# Patient Record
Sex: Male | Born: 1990 | Race: White | Hispanic: No | Marital: Single | Smoking: Current every day smoker
Health system: Southern US, Community
[De-identification: ages and names within clinical notes are randomized; demographics above are authoritative.]

---
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
## 2010-02-14 ENCOUNTER — Emergency Department (HOSPITAL_COMMUNITY): Admission: EM | Admit: 2010-02-14 | Discharge: 2010-02-14 | Payer: Self-pay | Admitting: Family Medicine

## 2010-06-24 ENCOUNTER — Encounter: Admission: RE | Admit: 2010-06-24 | Discharge: 2010-08-19 | Payer: Self-pay | Admitting: Family Medicine

## 2011-04-24 NOTE — H&P (Signed)
   NAME:  Benjamin Conrad, Benjamin Conrad                          ACCOUNT NO.:  [id number]   MEDICAL RECORD NO.:  [id number]                   PATIENT TYPE:  EMS   LOCATION:  [facility]O                                 FACILITY:  [facility]   PHYSICIAN:  Burnard Bunting, M.D.                 DATE OF BIRTH:  [birth year]   DATE OF ADMISSION:  12/28/2002  DATE OF DISCHARGE:                                HISTORY & PHYSICAL   CHIEF COMPLAINT:  Right wrist pain.   HISTORY OF PRESENT ILLNESS:  The patient is an 20 year old right-hand-  dominant patient who fell on his right wrist today jumping over a fence.  He  reports right wrist pain and denies any other orthopedic complaints.  He did  not lose consciousness.   PAST MEDICAL AND SURGICAL HISTORY:  Past medical and surgical history is  notable for a left brachial plexus birth palsy.   MEDICATIONS:  No medications currently.   ALLERGIES:  No allergies currently.   PHYSICAL EXAMINATION:  GENERAL:  On exam, he is in mild distress.  CHEST:  Chest is clear to auscultation.  HEART:  Heart beat is regular rate and rhythm.  ABDOMEN:  Exam is benign.  EXTREMITIES:  Right elbow and shoulder range of motion are intact.  Right  wrist has a deformity, apex dorsal.  Capillary refill is 2 to 3 seconds.  Radial and ulnar sensation is intact.  Median sensation is decreased.  Compartments are soft and he does have a weak EPL, FPL and interosseous  function.   X-RAY FINDINGS:  X-rays show both-bone forearm fracture which is distal with  100% radial displacement.   IMPRESSION:  Both-bone forearm fracture.   PLAN:  Closed reduction and percutaneous pinning versus open reduction.  Risks and benefits were discussed with the patient and the family.  Primary  risks include, but are not limited to, infection, nonunion, malunion, need  for more surgery, nerve and vessel damage.  The patient understands the  risks and benefits and will proceed.            Burnard Bunting, M.D.    GSD/MEDQ  D:  12/28/2002  T:  12/29/2002  Job:  [id number]

## 2011-04-24 NOTE — Op Note (Signed)
   NAME:  Benjamin Conrad, Benjamin Conrad                          ACCOUNT NO.:  [id number]   MEDICAL RECORD NO.:  [id number]                   PATIENT TYPE:  INP   LOCATION:  6127                                 FACILITY:  [facility]   PHYSICIAN:  Burnard Bunting, M.D.                 DATE OF BIRTH:  01-06-1991   DATE OF PROCEDURE:  12/28/2002  DATE OF DISCHARGE:  12/29/2002                                 OPERATIVE REPORT   PREOPERATIVE DIAGNOSIS:  Right both bones forearm fracture.   POSTOPERATIVE DIAGNOSIS:  Right both bones forearm fracture.   PROCEDURE:  Closed reduction and percutaneous pinning of right both bones  forearm fracture.   SURGEON:  Burnard Bunting, M.D.   ANESTHESIA:  General endotracheal.   ESTIMATED BLOOD LOSS:  2 cc.   DRAINS:  None.   PROCEDURE IN DETAIL:  The patient was brought to the operating room where  general endotracheal anesthesia was induced. Preoperatively, antibiotics  were administered.  The right arm was prepped with DuraPrep solution and  draped in a sterile manner.  Under fluoroscopy, the patient's both bones  distal forearm fracture was reduced.  This good reduction was achieved on  the second attempt.  At this time, two small incisions were made distal to  the radial styloid.  Blunt dissection was performed, and two 0.62 K wires  were placed across from the radial styloid across the fracture site.  Good  reduction was achieved.  Both pins were placed and were passed in their  first attempt with no extra passes through the growth plate.  A good  reduction was achieved.  Pins were advanced, Bactroban cream was placed  around the pin sites, along with Xeroform dressing.  The patient was then  placed in a long arm double sugar tong splint.  Good reduction was from the  AP and lateral planes. The patient tolerated the procedure well without  immediate complications and was transferred to the recovery room in stable  condition.               Burnard Bunting, M.D.    GSD/MEDQ  D:  01/01/2003  T:  01/01/2003  Job:  [id number]

## 2011-07-05 ENCOUNTER — Inpatient Hospital Stay (INDEPENDENT_AMBULATORY_CARE_PROVIDER_SITE_OTHER)
Admission: RE | Admit: 2011-07-05 | Discharge: 2011-07-05 | Disposition: A | Discharge status: Home / Self Care | Payer: BC Managed Care – PPO | Source: Ambulatory Visit | Attending: Family Medicine | Admitting: Family Medicine

## 2011-07-05 DIAGNOSIS — H60399 Other infective otitis externa, unspecified ear: Secondary | ICD-10-CM

## 2012-05-24 ENCOUNTER — Emergency Department (HOSPITAL_COMMUNITY)
Admission: EM | Admit: 2012-05-24 | Discharge: 2012-05-24 | Disposition: A | Discharge status: Home / Self Care | Payer: BC Managed Care – PPO | Source: Home / Self Care | Attending: Emergency Medicine | Admitting: Emergency Medicine

## 2012-05-24 ENCOUNTER — Encounter (HOSPITAL_COMMUNITY): Payer: Self-pay | Admitting: Emergency Medicine

## 2012-05-24 DIAGNOSIS — T148XXA Other injury of unspecified body region, initial encounter: Secondary | ICD-10-CM

## 2012-05-24 DIAGNOSIS — W57XXXA Bitten or stung by nonvenomous insect and other nonvenomous arthropods, initial encounter: Secondary | ICD-10-CM

## 2012-05-24 DIAGNOSIS — T148 Other injury of unspecified body region: Secondary | ICD-10-CM

## 2012-05-24 MED ORDER — MUPIROCIN 2 % EX OINT: TOPICAL_OINTMENT | Freq: Three times a day (TID) | CUTANEOUS | Status: AC

## 2012-05-24 MED ORDER — DOXYCYCLINE HYCLATE 100 MG PO TABS: 100.0000 mg | ORAL_TABLET | Freq: Two times a day (BID) | ORAL | Status: AC

## 2012-05-24 NOTE — ED Provider Notes (Signed)
Chief Complaint  Patient presents with  . Insect Bite    History of Present Illness:   The patient is a 21 year old male who found a tick attached to his right jaw at the midline about 2 days ago. He was able to remove the tick. There is a crusted area where the tick was attached surrounded by a swelling, tenderness, and itching. He denies any fever or chills. He does have a slight headache. He denies any stiff neck, muscle aches, or generalized skin rash.  Review of Systems:  Other than noted above, the patient denies any of the following symptoms: Systemic:  No fever, chills, sweats, weight loss, or fatigue. ENT:  No nasal congestion, rhinorrhea, sore throat, swelling of lips, tongue or throat. Resp:  No cough, wheezing, or shortness of breath. Skin:  No rash, itching, nodules, or suspicious lesions.  PMFSH:  Past medical history, family history, social history, meds, and allergies were reviewed.  Physical Exam:   Vital signs:  BP 103/68  Pulse 54  Temp 97.9 F (36.6 C) (Oral)  Resp 16  SpO2 97% Gen:  Alert, oriented, in no distress. ENT:  Pharynx clear, no intraoral lesions, moist mucous membranes. Lungs:  Clear to auscultation. Skin:  On the left jaw at the midline he has an 8mm crusted area surrounded by a 4 x 4 centimeter area of erythema. This was mildly tender to palpation. There was no fluctuance, no regional adenopathy.  Assessment:  The encounter diagnosis was Insect bite. Tick bite with localized infection versus allergic reaction to the tick bite. No evidence of systemic illness right now.  Plan:   1.  The following meds were prescribed:   New Prescriptions   DOXYCYCLINE (VIBRA-TABS) 100 MG TABLET    Take 1 tablet (100 mg total) by mouth 2 (two) times daily.   MUPIROCIN OINTMENT (BACTROBAN) 2 %    Apply topically 3 (three) times daily.   2.  The patient was instructed in symptomatic care and handouts were given. 3.  The patient was told to return if becoming worse in  any way, if no better in 3 or 4 days, and given some red flag symptoms that would indicate earlier return. The patient was told to be on the lookout for signs of systemic illness: Tick bite such as fever, chills, headache, muscle aches, or generalized rash and if he develops anything to return as soon as possible. 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 He's on doxycycline so that should get of any intubating tick borne illness.     Reuben Likes, MD 05/24/12 [phone]

## 2012-05-24 NOTE — ED Notes (Signed)
Pt herewith redness and tenderness under left jaw from tick bite x3  dys ago.states he pulled a live bite off yesterday.c/o slight h/a but no body aches or fever.using alcohol but has some clear drainage.tender to touch and sore

## 2012-05-24 NOTE — Discharge Instructions (Signed)
Wood Tick Bite Ticks are insects that attach themselves to the skin. Most tick bites are harmless, but sometimes ticks carry diseases that can make a person quite ill. The chance of getting ill depends on:  The kind of tick that bites you.   Time of year.   How long the tick is attached.   Geographic location.  Wood ticks are also called dog ticks. They are generally black. They can have white markings. They live in shrubs and grassy areas. They are larger than deer ticks. Wood ticks are about the size of a watermelon seed. They have a hard body. The most common places for ticks to attach themselves are the scalp, neck, armpits, waist, and groin. Wood ticks may stay attached for up to 2 weeks. TICKS MUST BE REMOVED AS SOON AS POSSIBLE TO HELP PREVENT DISEASES CAUSED BY TICK BITES.  TO REMOVE A TICK: 1. If available, put on latex gloves before trying to remove a tick.  2. Grasp the tick as close to the skin as possible, with curved forceps, fine tweezers or a special tick removal tool.  3. Pull gently with steady pressure until the tick lets go. Do not twist the tick or jerk it suddenly. This may break off the tick's head or mouth parts.  4. Do not crush the tick's body. This could force disease-carrying fluids from the tick into your body.  5. After the tick is removed, wash the bite area and your hands with soap and water or other disinfectant.  6. Apply a small amount of antiseptic cream or ointment to the bite site.  7. Wash and disinfect any instruments that were used.  8. Save the tick in a jar or plastic bag for later identification. Preserve the tick with a bit of alcohol or put it in the freezer.  9. Do not apply a hot match, petroleum jelly, or fingernail polish to the tick. This does not work and may increase the chances of disease from the tick bite.  YOU MAY NEED TO SEE YOUR CAREGIVER FOR A TETANUS SHOT NOW IF:  You have no idea when you had the last one.   You have never had a  tetanus shot before.  If you need a tetanus shot, and you decide not to get one, there is a rare chance of getting tetanus. Sickness from tetanus can be serious. If you get a tetanus shot, your arm may swell, get red and warm to the touch at the shot site. This is common and not a problem. PREVENTION  Wear protective clothing. Long sleeves and pants are best.   Wear white clothes to see ticks more easily   Tuck your pant legs into your socks.   If walking on trail, stay in the middle of the trail to avoid brushing against bushes.   Put insect repellent on all exposed skin and along boot tops, pant legs and sleeve cuffs   Check clothing, hair and skin repeatedly and before coming inside.   Brush off any ticks that are not attached.  SEEK MEDICAL CARE IF:   You cannot remove a tick or part of the tick that is left in the skin.   Unexplained fever.   Redness and swelling in the area of the tick bite.   Tender, swollen lymph glands.   Diarrhea.   Weight loss.   Cough.   Fatigue.   Muscle, joint or bone pain.   Belly pain.   Headache.   Rash.    SEEK IMMEDIATE MEDICAL CARE IF:   You develop an oral temperature above 102 F (38.9 C).   You are having trouble walking or moving your legs.   Numbness in the legs.   Shortness of breath.   Confusion.   Repeated vomiting.  Document Released: 11/20/2000 Document Revised: 11/12/2011 Document Reviewed: 10/29/2008 [employer] Patient Information 2012 [employer]. 

## 2016-03-26 ENCOUNTER — Emergency Department (HOSPITAL_COMMUNITY): Payer: Managed Care, Other (non HMO)

## 2016-03-26 ENCOUNTER — Encounter (HOSPITAL_COMMUNITY): Payer: Self-pay | Admitting: Emergency Medicine

## 2016-03-26 ENCOUNTER — Emergency Department (HOSPITAL_COMMUNITY)
Admission: EM | Admit: 2016-03-26 | Discharge: 2016-03-26 | Disposition: A | Discharge status: Home / Self Care | Payer: Managed Care, Other (non HMO) | Attending: Emergency Medicine | Admitting: Emergency Medicine

## 2016-03-26 DIAGNOSIS — R1013 Epigastric pain: Secondary | ICD-10-CM | POA: Insufficient documentation

## 2016-03-26 DIAGNOSIS — R109 Unspecified abdominal pain: Secondary | ICD-10-CM

## 2016-03-26 DIAGNOSIS — R079 Chest pain, unspecified: Secondary | ICD-10-CM | POA: Diagnosis present

## 2016-03-26 DIAGNOSIS — R10816 Epigastric abdominal tenderness: Secondary | ICD-10-CM

## 2016-03-26 DIAGNOSIS — F17200 Nicotine dependence, unspecified, uncomplicated: Secondary | ICD-10-CM | POA: Insufficient documentation

## 2016-03-26 LAB — CBC
HCT: 45.3 % (ref 39.0–52.0)
HEMOGLOBIN: 15.1 g/dL (ref 13.0–17.0)
MCH: 30.2 pg (ref 26.0–34.0)
MCHC: 33.3 g/dL (ref 30.0–36.0)
MCV: 90.6 fL (ref 78.0–100.0)
Platelets: 290 10*3/uL (ref 150–400)
RBC: 5 MIL/uL (ref 4.22–5.81)
RDW: 13.1 % (ref 11.5–15.5)
WBC: 7.7 10*3/uL (ref 4.0–10.5)

## 2016-03-26 LAB — HEPATIC FUNCTION PANEL
ALT: 35 U/L (ref 17–63)
AST: 39 U/L (ref 15–41)
Albumin: 4.2 g/dL (ref 3.5–5.0)
Alkaline Phosphatase: 45 U/L (ref 38–126)
BILIRUBIN DIRECT: 0.4 mg/dL (ref 0.1–0.5)
BILIRUBIN INDIRECT: 0.6 mg/dL (ref 0.3–0.9)
BILIRUBIN TOTAL: 1 mg/dL (ref 0.3–1.2)
Total Protein: 6.7 g/dL (ref 6.5–8.1)

## 2016-03-26 LAB — BASIC METABOLIC PANEL
ANION GAP: 10 (ref 5–15)
BUN: 17 mg/dL (ref 6–20)
CALCIUM: 9.2 mg/dL (ref 8.9–10.3)
CO2: 21 mmol/L — AB (ref 22–32)
Chloride: 109 mmol/L (ref 101–111)
Creatinine, Ser: 0.92 mg/dL (ref 0.61–1.24)
GFR calc Af Amer: >60 mL/min (ref >60)
GLUCOSE: 96 mg/dL (ref 65–99)
Potassium: 4.8 mmol/L (ref 3.5–5.1)
Sodium: 140 mmol/L (ref 135–145)

## 2016-03-26 LAB — I-STAT TROPONIN, ED: TROPONIN I, POC: 0 ng/mL (ref 0.00–0.08)

## 2016-03-26 LAB — LIPASE, BLOOD: Lipase: 28 U/L (ref 11–51)

## 2016-03-26 MED ORDER — PANTOPRAZOLE SODIUM 40 MG PO TBEC
40.0000 mg | DELAYED_RELEASE_TABLET | Freq: Once | ORAL | Status: AC
  Administered 2016-03-26: 40 mg via ORAL
  Filled 2016-03-26: qty 1

## 2016-03-26 MED ORDER — PANTOPRAZOLE SODIUM 40 MG PO TBEC: 40.0000 mg | DELAYED_RELEASE_TABLET | Freq: Every day | ORAL | Status: AC

## 2016-03-26 MED ORDER — GI COCKTAIL ~~LOC~~
30.0000 mL | Freq: Once | ORAL | Status: AC
  Administered 2016-03-26: 30 mL via ORAL
  Filled 2016-03-26: qty 30

## 2016-03-26 NOTE — ED Notes (Addendum)
Patient transported to Ultrasound 

## 2016-03-26 NOTE — ED Notes (Signed)
Pt. reports central chest pain onset last night , denies SOB , no nausea or diaphoresis .

## 2016-03-26 NOTE — ED Provider Notes (Signed)
CSN: [id number]     Arrival date & time 03/26/16  0244 History   First MD Initiated Contact with Patient 03/26/16 0500     Chief Complaint  Patient presents with  . Chest Pain     (Consider location/radiation/quality/duration/timing/severity/associated sxs/prior Treatment) Patient is a 25 y.o. male presenting with chest pain. The history is provided by the patient.  Chest Pain He states that he went to bed with pain across his chest and into his back. It wasn't severe at that point, but he woke up with pain significantly more severe. He rated pain at 7/10. It is subsiding on is down to 3/10. He denies associated dyspnea, nausea, diaphoresis. Pain is not affected by body position or movement. He notices nothing makes it better or worse. He does not remember having similar pains before. He is a recent smoker having quit the beginning of March. He denies ethanol use and denies history of hypertension, diabetes, hyperlipidemia. He had eaten macaroni and cheese before going to bed.  History reviewed. No pertinent past medical history. History reviewed. No pertinent past surgical history. No family history on file. Social History  Substance Use Topics  . Smoking status: Current Every Day Smoker  . Smokeless tobacco: None  . Alcohol Use: No    Review of Systems  Cardiovascular: Positive for chest pain.  All other systems reviewed and are negative.     Allergies  Review of patient's allergies indicates no known allergies.  Home Medications   Prior to Admission medications   Not on File   BP 119/85 mmHg  Pulse 57  Temp(Src) 97.9 F (36.6 C) (Oral)  Resp 19  Ht 5\' 9"  (1.753 m)  Wt 255 lb (115.667 kg)  BMI 37.64 kg/m2  SpO2 98% Physical Exam  Nursing note and vitals reviewed.  Obese 25 year old male, resting comfortably and in no acute distress. Vital signs are significant for mild bradycardia. Oxygen saturation is 98%, which is normal. Head is normocephalic and atraumatic.  PERRLA, EOMI. Oropharynx is clear. Neck is nontender and supple without adenopathy or JVD. Back is nontender and there is no CVA tenderness. Lungs are clear without rales, wheezes, or rhonchi. Chest has mild tenderness across the midsternal area. Heart has regular rate and rhythm without murmur. Abdomen is soft, flat, with moderate epigastric and right upper quadrant tenderness with positive Murphy sign. There are no masses or hepatosplenomegaly and peristalsis is hypoactive. Extremities have no cyanosis or edema, full range of motion is present. Skin is warm and dry without rash. Neurologic: Mental status is normal, cranial nerves are intact, there are no motor or sensory deficits.  ED Course  Procedures (including critical care time) Labs Review Results for orders placed or performed during the hospital encounter of 03/26/16  Basic metabolic panel  Result Value Ref Range   Sodium 140 135 - 145 mmol/L   Potassium 4.8 3.5 - 5.1 mmol/L   Chloride 109 101 - 111 mmol/L   CO2 21 (L) 22 - 32 mmol/L   Glucose, Bld 96 65 - 99 mg/dL   BUN 17 6 - 20 mg/dL   Creatinine, Ser 4.090.92 0.61 - 1.24 mg/dL   Calcium 9.2 8.9 - 81.110.3 mg/dL   GFR calc non Af Amer >60 >60 mL/min   GFR calc Af Amer >60 >60 mL/min   Anion gap 10 5 - 15  CBC  Result Value Ref Range   WBC 7.7 4.0 - 10.5 K/uL   RBC 5.00 4.22 - 5.81 MIL/uL  Hemoglobin 15.1 13.0 - 17.0 g/dL   HCT 16.1 09.6 - 04.5 %   MCV 90.6 78.0 - 100.0 fL   MCH 30.2 26.0 - 34.0 pg   MCHC 33.3 30.0 - 36.0 g/dL   RDW 40.9 81.1 - 91.4 %   Platelets 290 150 - 400 K/uL  Hepatic function panel  Result Value Ref Range   Total Protein 6.7 6.5 - 8.1 g/dL   Albumin 4.2 3.5 - 5.0 g/dL   AST 39 15 - 41 U/L   ALT 35 17 - 63 U/L   Alkaline Phosphatase 45 38 - 126 U/L   Total Bilirubin 1.0 0.3 - 1.2 mg/dL   Bilirubin, Direct 0.4 0.1 - 0.5 mg/dL   Indirect Bilirubin 0.6 0.3 - 0.9 mg/dL  Lipase, blood  Result Value Ref Range   Lipase 28 11 - 51 U/L  I-stat  troponin, ED  Result Value Ref Range   Troponin i, poc 0.00 0.00 - 0.08 ng/mL   Comment 3           Imaging Review Dg Chest 2 View  03/26/2016  CLINICAL DATA:  Chest pain tonight. EXAM: CHEST  2 VIEW COMPARISON:  None. FINDINGS: The cardiomediastinal contours are normal. The lungs are clear. Pulmonary vasculature is normal. No consolidation, pleural effusion, or pneumothorax. No acute osseous abnormalities are seen. IMPRESSION: No acute pulmonary process. Electronically Signed   By: Rubye Oaks M.D.   On: 03/26/2016 03:15   US Abdomen Complete  03/26/2016  CLINICAL DATA:  Abdominal pain and Mid chest pain since last night. EXAM: ABDOMEN ULTRASOUND COMPLETE COMPARISON:  None. FINDINGS: Gallbladder: No gallstones or wall thickening visualized. No sonographic Murphy sign noted by sonographer. Common bile duct: Diameter: 4.3 mm, normal Liver: No focal lesion identified. Within normal limits in parenchymal echogenicity. IVC: No abnormality visualized. Pancreas: Visualized portion unremarkable. Spleen: Size and appearance within normal limits. Right Kidney: Length: 10.8 cm. Echogenicity within normal limits. No mass or hydronephrosis visualized. Left Kidney: Length: 10.1 cm. Echogenicity within normal limits. No mass or hydronephrosis visualized. Abdominal aorta: No aneurysm visualized. Other findings: None. IMPRESSION: Normal examination. Electronically Signed   By: Burman Nieves M.D.   On: 03/26/2016 06:54   I have personally reviewed and evaluated these images and lab results as part of my medical decision-making.   EKG Interpretation   Date/Time:  Thursday March 26 2016 02:48:29 EDT Ventricular Rate:  57 PR Interval:  148 QRS Duration: 94 QT Interval:  400 QTC Calculation: 389 R Axis:   -2 Text Interpretation:  Sinus bradycardia with sinus arrhythmia Otherwise  normal ECG When compared with ECG of 08/10/2001, No significant change was  found Confirmed by [facility]  MD, Annamary Buschman ([id number]) on  03/26/2016 2:51:54 AM      MDM   Final diagnoses:  Chest pain, unspecified chest pain type  Epigastric abdominal tenderness on direct palpation    Chest pain of uncertain cause. 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 However, his area of greater tenderness seems to be in the epigastric area and right upper quadrant. I am worried about possible biliary colic. ECG is normal and troponin is 0. Chest x-ray is unremarkable. Basic metabolic panel is normal. Hepatic function panel and lipase are pending. Abdominal ultrasound has been ordered. Old records are reviewed and he has no relevant past visits.  Ultrasound-shows no evidence of cholelithiasis. At this point, his discomfort is  almost completely gone. He is given a GI cocktail. I feel that his pain is most likely GI in origin and is discharged with prescription for pantoprazole. Follow-up with PCP. Encouraged to continue abstinence from tobacco.  Dione Booze, MD 03/26/16 [phone]

## 2016-03-26 NOTE — Discharge Instructions (Signed)
Take antacids as needed.  Abdominal Pain, Adult Many things can cause abdominal pain. Usually, abdominal pain is not caused by a disease and will improve without treatment. It can often be observed and treated at home. Your health care provider will do a physical exam and possibly order blood tests and X-rays to help determine the seriousness of your pain. However, in many cases, more time must pass before a clear cause of the pain can be found. Before that point, your health care provider may not know if you need more testing or further treatment. HOME CARE INSTRUCTIONS Monitor your abdominal pain for any changes. The following actions may help to alleviate any discomfort you are experiencing:  Only take over-the-counter or prescription medicines as directed by your health care provider.  Do not take laxatives unless directed to do so by your health care provider.  Try a clear liquid diet (broth, tea, or water) as directed by your health care provider. Slowly move to a bland diet as tolerated. SEEK MEDICAL CARE IF:  You have unexplained abdominal pain.  You have abdominal pain associated with nausea or diarrhea.  You have pain when you urinate or have a bowel movement.  You experience abdominal pain that wakes you in the night.  You have abdominal pain that is worsened or improved by eating food.  You have abdominal pain that is worsened with eating fatty foods.  You have a fever. SEEK IMMEDIATE MEDICAL CARE IF:  Your pain does not go away within 2 hours.  You keep throwing up (vomiting).  Your pain is felt only in portions of the abdomen, such as the right side or the left lower portion of the abdomen.  You pass bloody or black tarry stools. MAKE SURE YOU:  Understand these instructions.  Will watch your condition.  Will get help right away if you are not doing well or get worse.   This information is not intended to replace advice given to you by your health care  provider. Make sure you discuss any questions you have with your health care provider.   Document Released: 09/02/2005 Document Revised: 08/14/2015 Document Reviewed: 08/02/2013 Elsevier Interactive Patient Education 2016 Elsevier [employer].  Nonspecific Chest Pain  Chest pain can be caused by many different conditions. There is always a chance that your pain could be related to something serious, such as a heart attack or a blood clot in your lungs. Chest pain can also be caused by conditions that are not life-threatening. If you have chest pain, it is very important to follow up with your health care provider. CAUSES  Chest pain can be caused by:  Heartburn.  Pneumonia or bronchitis.  Anxiety or stress.  Inflammation around your heart (pericarditis) or lung (pleuritis or pleurisy).  A blood clot in your lung.  A collapsed lung (pneumothorax). It can develop suddenly on its own (spontaneous pneumothorax) or from trauma to the chest.  Shingles infection (varicella-zoster virus).  Heart attack.  Damage to the bones, muscles, and cartilage that make up your chest wall. This can include:  Bruised bones due to injury.  Strained muscles or cartilage due to frequent or repeated coughing or overwork.  Fracture to one or more ribs.  Sore cartilage due to inflammation (costochondritis). RISK FACTORS  Risk factors for chest pain may include:  Activities that increase your risk for trauma or injury to your chest.  Respiratory infections or conditions that cause frequent coughing.  Medical conditions or overeating that can cause heartburn.  Heart disease or family history of heart disease.  Conditions or health behaviors that increase your risk of developing a blood clot.  Having had chicken pox (varicella zoster). SIGNS AND SYMPTOMS Chest pain can feel like:  Burning or tingling on the surface of your chest or deep in your chest.  Crushing, pressure, aching, or squeezing  pain.  Dull or sharp pain that is worse when you move, cough, or take a deep breath.  Pain that is also felt in your back, neck, shoulder, or arm, or pain that spreads to any of these areas. Your chest pain may come and go, or it may stay constant. DIAGNOSIS Lab tests or other studies may be needed to find the cause of your pain. Your health care provider may have you take a test called an ambulatory ECG (electrocardiogram). An ECG records your heartbeat patterns at the time the test is performed. You may also have other tests, such as:  Transthoracic echocardiogram (TTE). During echocardiography, sound waves are used to create a picture of all of the heart structures and to look at how blood flows through your heart.  Transesophageal echocardiogram (TEE).This is a more advanced imaging test that obtains images from inside your body. It allows your health care provider to see your heart in finer detail.  Cardiac monitoring. This allows your health care provider to monitor your heart rate and rhythm in real time.  Holter monitor. This is a portable device that records your heartbeat and can help to diagnose abnormal heartbeats. It allows your health care provider to track your heart activity for several days, if needed.  Stress tests. These can be done through exercise or by taking medicine that makes your heart beat more quickly.  Blood tests.  Imaging tests. TREATMENT  Your treatment depends on what is causing your chest pain. Treatment may include:  Medicines. These may include:  Acid blockers for heartburn.  Anti-inflammatory medicine.  Pain medicine for inflammatory conditions.  Antibiotic medicine, if an infection is present.  Medicines to dissolve blood clots.  Medicines to treat coronary artery disease.  Supportive care for conditions that do not require medicines. This may include:  Resting.  Applying heat or cold packs to injured areas.  Limiting activities  until pain decreases. HOME CARE INSTRUCTIONS  If you were prescribed an antibiotic medicine, finish it all even if you start to feel better.  Avoid any activities that bring on chest pain.  Do not use any tobacco products, including cigarettes, chewing tobacco, or electronic cigarettes. If you need help quitting, ask your health care provider.  Do not drink alcohol.  Take medicines only as directed by your health care provider.  Keep all follow-up visits as directed by your health care provider. This is important. This includes any further testing if your chest pain does not go away.  If heartburn is the cause for your chest pain, you may be told to keep your head raised (elevated) while sleeping. This reduces the chance that acid will go from your stomach into your esophagus.  Make lifestyle changes as directed by your health care provider. These may include:  Getting regular exercise. Ask your health care provider to suggest some activities that are safe for you.  Eating a heart-healthy diet. A registered dietitian can help you to learn healthy eating options.  Maintaining a healthy weight.  Managing diabetes, if necessary.  Reducing stress. SEEK MEDICAL CARE IF:  Your chest pain does not go away after treatment.  You  have a rash with blisters on your chest.  You have a fever. SEEK IMMEDIATE MEDICAL CARE IF:   Your chest pain is worse.  You have an increasing cough, or you cough up blood.  You have severe abdominal pain.  You have severe weakness.  You faint.  You have chills.  You have sudden, unexplained chest discomfort.  You have sudden, unexplained discomfort in your arms, back, neck, or jaw.  You have shortness of breath at any time.  You suddenly start to sweat, or your skin gets clammy.  You feel nauseous or you vomit.  You suddenly feel light-headed or dizzy.  Your heart begins to beat quickly, or it feels like it is skipping beats. These  symptoms may represent a serious problem that is an emergency. Do not wait to see if the symptoms will go away. Get medical help right away. Call your local emergency services (911 in the [place] Do not drive yourself to the hospital.   This information is not intended to replace advice given to you by your health care provider. Make sure you discuss any questions you have with your health care provider.   Document Released: 09/02/2005 Document Revised: 12/14/2014 Document Reviewed: 06/29/2014 Elsevier Interactive Patient Education 2016 Elsevier Inc.  Pantoprazole tablets What is this medicine? PANTOPRAZOLE (pan TOE pra zole) prevents the production of acid in the stomach. It is used to treat gastroesophageal reflux disease (GERD), inflammation of the esophagus, and Zollinger-Ellison syndrome. This medicine may be used for other purposes; ask your health care provider or pharmacist if you have questions. What should I tell my health care provider before I take this medicine? They need to know if you have any of these conditions: -liver disease -low levels of magnesium in the blood -an unusual or allergic reaction to omeprazole, lansoprazole, pantoprazole, rabeprazole, other medicines, foods, dyes, or preservatives -pregnant or trying to get pregnant -breast-feeding How should I use this medicine? Take this medicine by mouth. Swallow the tablets whole with a drink of water. Follow the directions on the prescription label. Do not crush, break, or chew. Take your medicine at regular intervals. Do not take your medicine more often than directed. Talk to your pediatrician regarding the use of this medicine in children. While this drug may be prescribed for children as young as 5 years for selected conditions, precautions do apply. Overdosage: If you think you have taken too much of this medicine contact a poison control center or emergency room at once. NOTE: This medicine is only for you. Do not  share this medicine with others. What if I miss a dose? If you miss a dose, take it as soon as you can. If it is almost time for your next dose, take only that dose. Do not take double or extra doses. What may interact with this medicine? Do not take this medicine with any of the following medications: -atazanavir -nelfinavir This medicine may also interact with the following medications: -ampicillin -delavirdine -erlotinib -iron salts -medicines for fungal infections like ketoconazole, itraconazole and voriconazole -methotrexate -mycophenolate mofetil -warfarin This list may not describe all possible interactions. Give your health care provider a list of all the medicines, herbs, non-prescription drugs, or dietary supplements you use. Also tell them if you smoke, drink alcohol, or use illegal drugs. Some items may interact with your medicine. What should I watch for while using this medicine? It can take several days before your stomach pain gets better. Check with your doctor or health care  professional if your condition does not start to get better, or if it gets worse. You may need blood work done while you are taking this medicine. What side effects may I notice from receiving this medicine? Side effects that you should report to your doctor or health care professional as soon as possible: -allergic reactions like skin rash, itching or hives, swelling of the face, lips, or tongue -bone, muscle or joint pain -breathing problems -chest pain or chest tightness -dark yellow or [place] urine -dizziness -fast, irregular heartbeat -feeling faint or lightheaded -fever or sore throat -muscle spasm -palpitations -redness, blistering, peeling or loosening of the skin, including inside the mouth -seizures -tremors -unusual bleeding or bruising -unusually weak or tired -yellowing of the eyes or skin Side effects that usually do not require medical attention (Report these to your doctor  or health care professional if they continue or are bothersome.): -constipation -diarrhea -dry mouth -headache -nausea This list may not describe all possible side effects. Call your doctor for medical advice about side effects. You may report side effects to FDA at 1-800-FDA-1088. Where should I keep my medicine? Keep out of the reach of children. Store at room temperature between 15 and 30 degrees C (59 and 86 degrees F). Protect from light and moisture. Throw away any unused medicine after the expiration date. NOTE: This sheet is a summary. It may not cover all possible information. If you have questions about this medicine, talk to your doctor, pharmacist, or health care provider.    2016, Elsevier/Gold Standard. (2015-01-11 14:45:56)

## 2016-03-26 NOTE — ED Notes (Signed)
PT ambulated with baseline gait; VSS; A&Ox3; no signs of distress; respirations even and unlabored; skin warm and dry; no questions upon discharge.  

## 2016-08-30 IMAGING — US US ABDOMEN COMPLETE
1 series · 14 of 25 positions shown · non-contrast
Comparison: None.

CLINICAL DATA: Abdominal pain and Mid chest pain since last night.

EXAM:
ABDOMEN ULTRASOUND COMPLETE

[Series 1: us abdomen complete · 0.22mm/px · 14 of 77 slices shown]
[im 1/77]
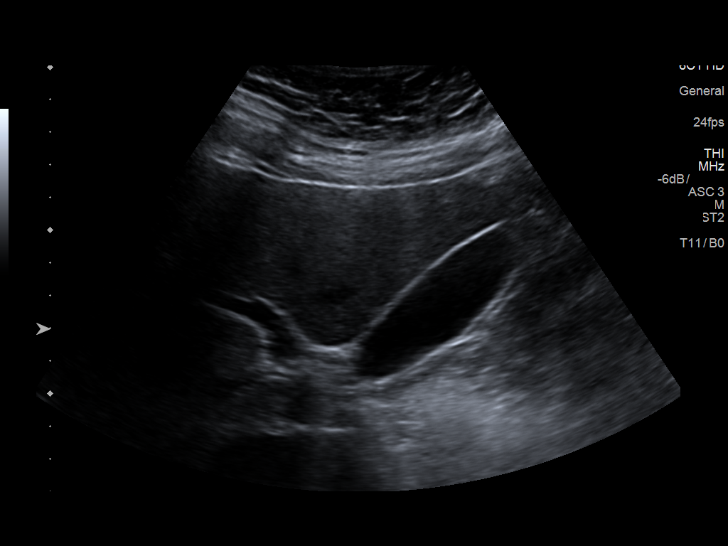
[im 7/77]
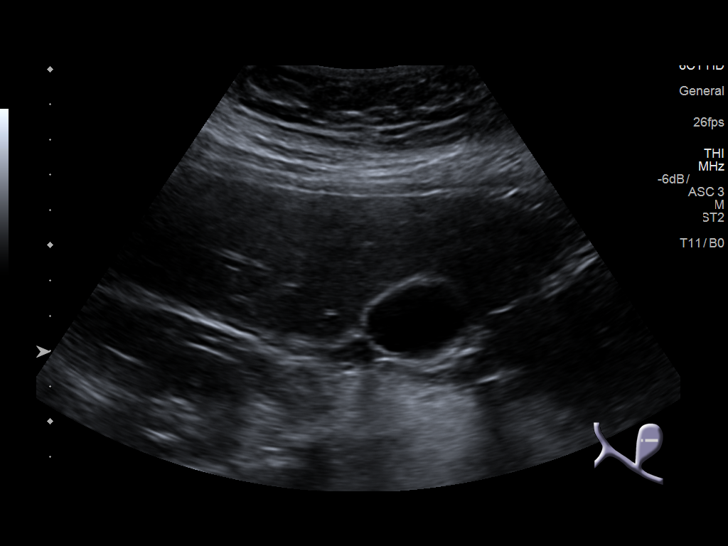
[im 13/77]
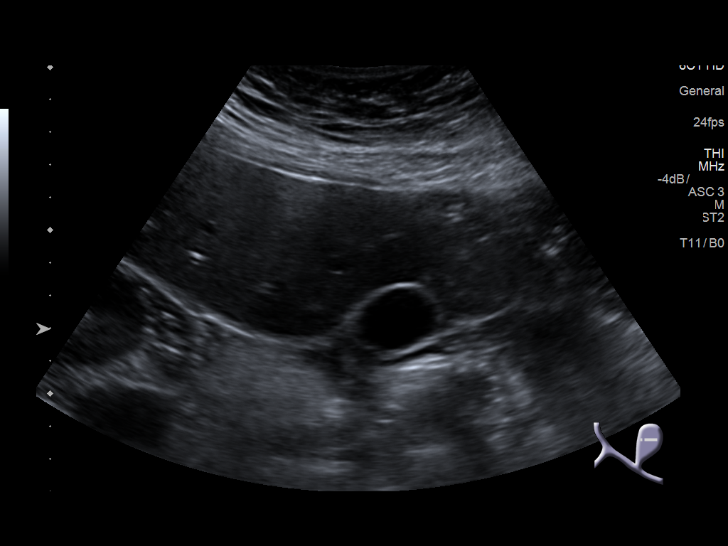
[im 20/77]
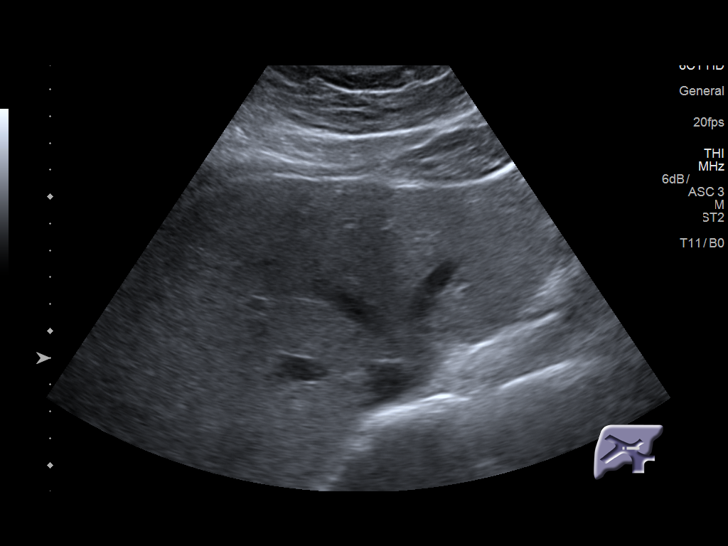
[im 26/77]
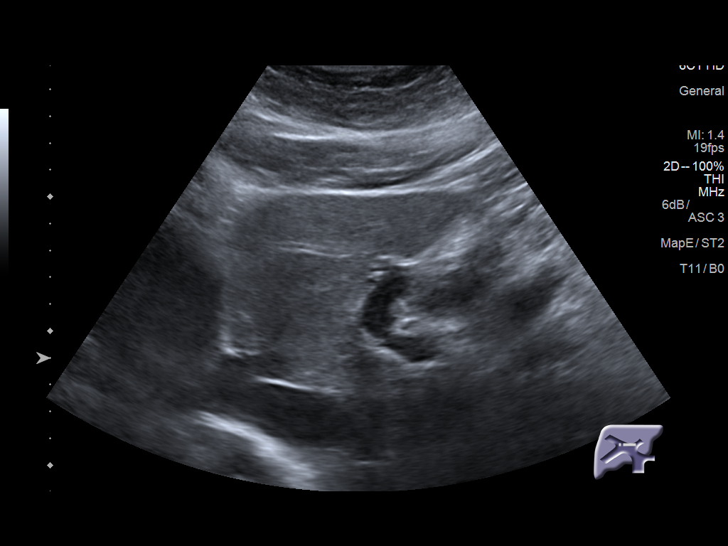
[im 29/77]
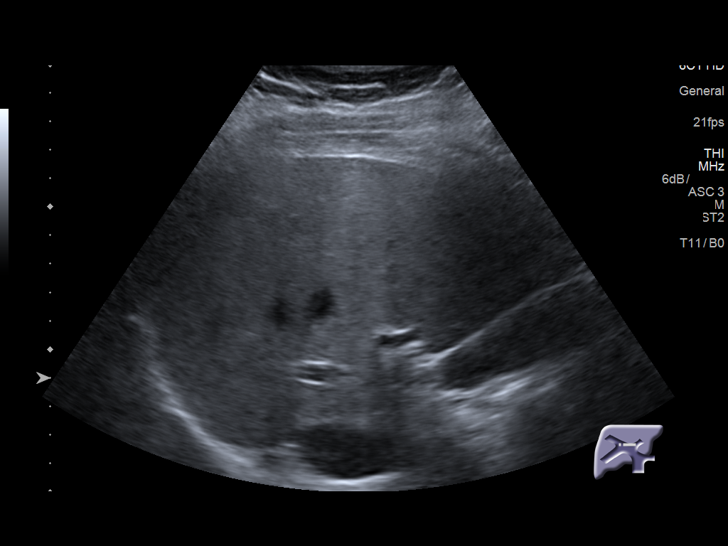
[im 35/77]
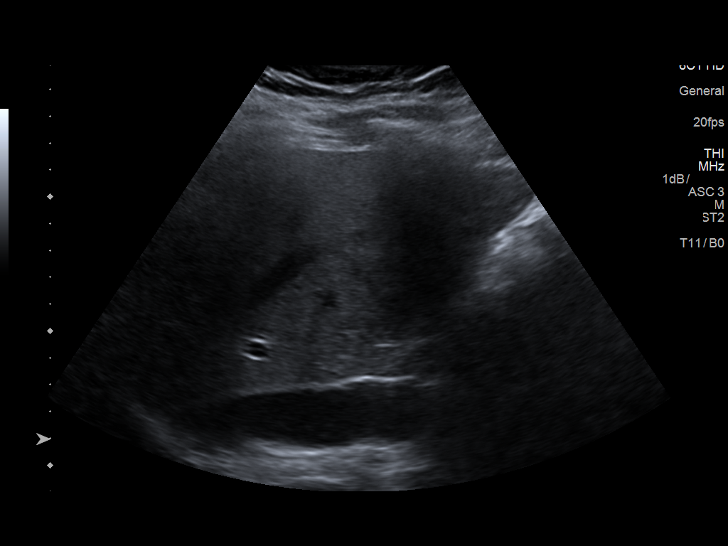
[im 42/77]
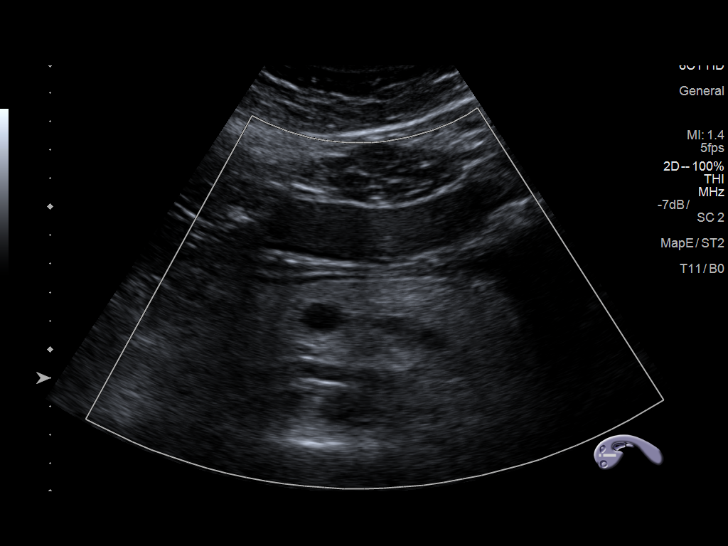
[im 48/77]
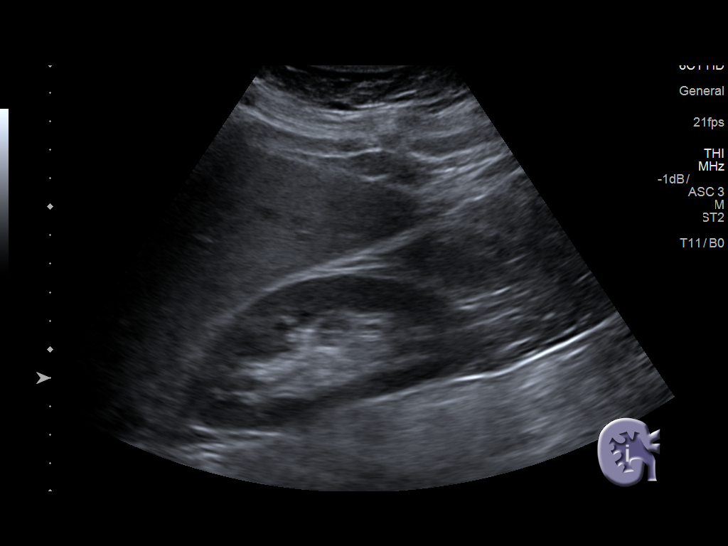
[im 51/77]
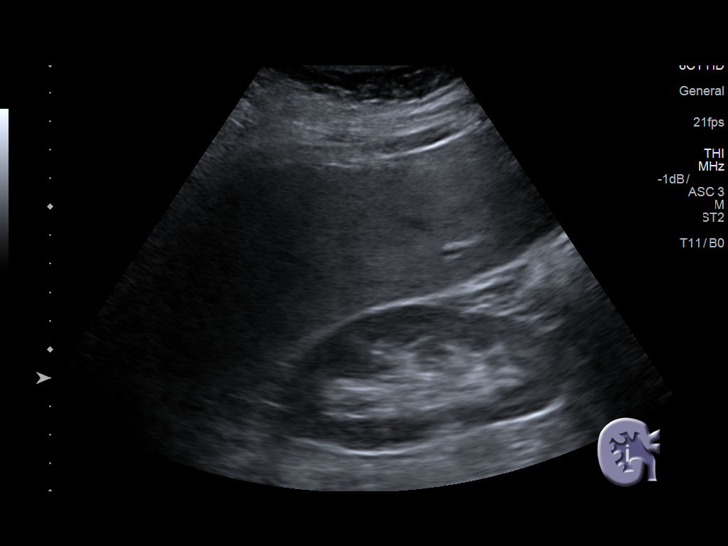
[im 58/77]
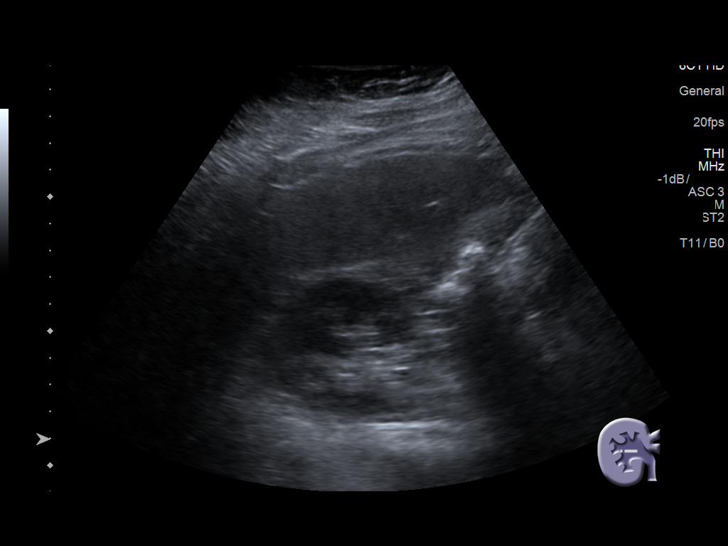
[im 64/77]
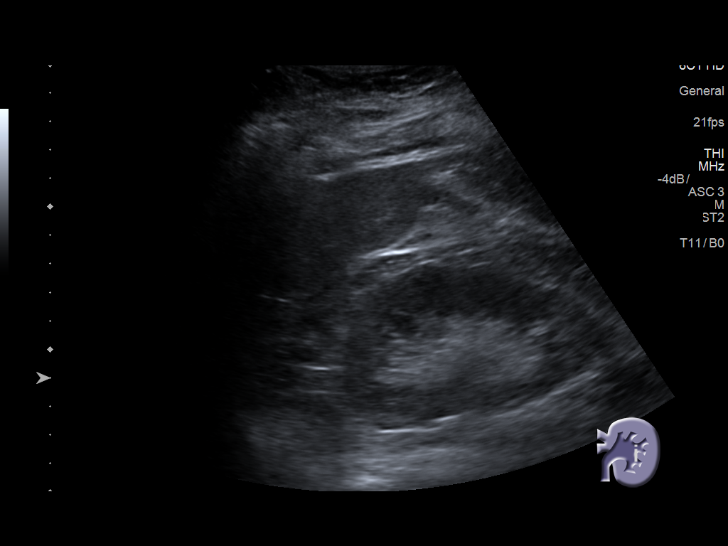
[im 70/77]
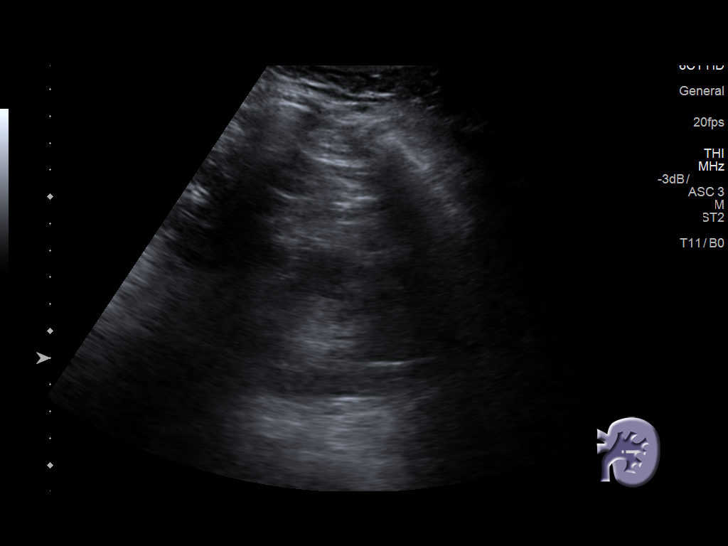
[im 77/77]
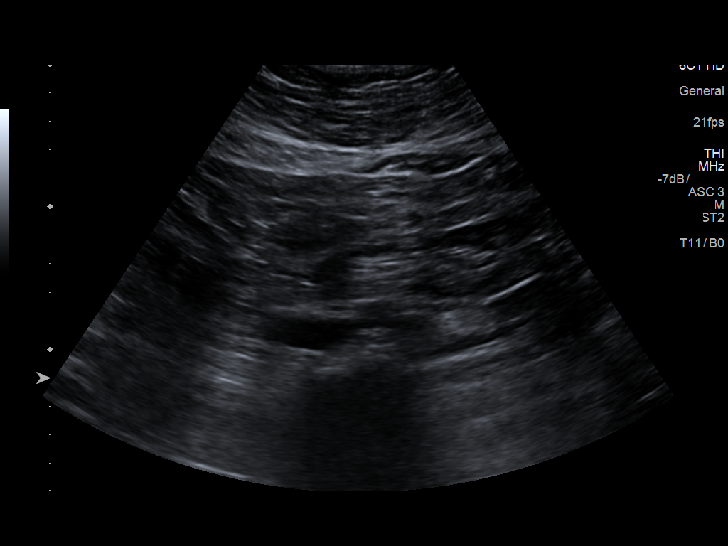

[14 of 25 positions shown; findings below may reference images not displayed]

FINDINGS: Gallbladder: No gallstones or wall thickening visualized. No
sonographic Murphy sign noted by sonographer.

Common bile duct: Diameter: 4.3 mm, normal

Liver: No focal lesion identified. Within normal limits in
parenchymal echogenicity.

IVC: No abnormality visualized.

Pancreas: Visualized portion unremarkable.

Spleen: Size and appearance within normal limits.

Right Kidney: Length: 10.8 cm. Echogenicity within normal limits. No
mass or hydronephrosis visualized.

Left Kidney: Length: 10.1 cm. Echogenicity within normal limits. No
mass or hydronephrosis visualized.

Abdominal aorta: No aneurysm visualized.

Other findings: None.
IMPRESSION: Normal examination.

## 2019-06-20 ENCOUNTER — Other Ambulatory Visit: Payer: Self-pay | Admitting: Otolaryngology

## 2019-06-20 DIAGNOSIS — H903 Sensorineural hearing loss, bilateral: Secondary | ICD-10-CM

## 2019-07-17 ENCOUNTER — Other Ambulatory Visit: Payer: Managed Care, Other (non HMO)

## 2019-11-20 ENCOUNTER — Other Ambulatory Visit: Payer: Self-pay

## 2019-11-20 DIAGNOSIS — Z20822 Contact with and (suspected) exposure to covid-19: Secondary | ICD-10-CM

## 2019-11-22 LAB — NOVEL CORONAVIRUS, NAA: SARS-CoV-2, NAA: DETECTED — AB

## 2019-11-30 ENCOUNTER — Ambulatory Visit: Payer: BC Managed Care – PPO | Attending: Internal Medicine

## 2019-11-30 DIAGNOSIS — Z20822 Contact with and (suspected) exposure to covid-19: Secondary | ICD-10-CM

## 2019-12-01 LAB — NOVEL CORONAVIRUS, NAA: SARS-CoV-2, NAA: NOT DETECTED

## 2022-02-17 ENCOUNTER — Encounter (HOSPITAL_COMMUNITY): Payer: Self-pay | Admitting: Emergency Medicine

## 2022-02-17 ENCOUNTER — Emergency Department (HOSPITAL_COMMUNITY)
Admission: EM | Admit: 2022-02-17 | Discharge: 2022-02-17 | Disposition: A | Discharge status: Home / Self Care | Payer: BC Managed Care – PPO | Attending: Emergency Medicine | Admitting: Emergency Medicine

## 2022-02-17 ENCOUNTER — Emergency Department (HOSPITAL_COMMUNITY): Payer: BC Managed Care – PPO

## 2022-02-17 DIAGNOSIS — R0789 Other chest pain: Secondary | ICD-10-CM | POA: Diagnosis not present

## 2022-02-17 DIAGNOSIS — R7401 Elevation of levels of liver transaminase levels: Secondary | ICD-10-CM | POA: Diagnosis not present

## 2022-02-17 DIAGNOSIS — R079 Chest pain, unspecified: Secondary | ICD-10-CM | POA: Diagnosis present

## 2022-02-17 DIAGNOSIS — R112 Nausea with vomiting, unspecified: Secondary | ICD-10-CM | POA: Diagnosis not present

## 2022-02-17 LAB — COMPREHENSIVE METABOLIC PANEL
ALT: 47 U/L — ABNORMAL HIGH (ref 0–44)
AST: 44 U/L — ABNORMAL HIGH (ref 15–41)
Albumin: 4.1 g/dL (ref 3.5–5.0)
Alkaline Phosphatase: 57 U/L (ref 38–126)
Anion gap: 9 (ref 5–15)
BUN: 10 mg/dL (ref 6–20)
CO2: 23 mmol/L (ref 22–32)
Calcium: 8.5 mg/dL — ABNORMAL LOW (ref 8.9–10.3)
Chloride: 105 mmol/L (ref 98–111)
Creatinine, Ser: 0.74 mg/dL (ref 0.61–1.24)
GFR, Estimated: >60 mL/min (ref >60)
Glucose, Bld: 102 mg/dL — ABNORMAL HIGH (ref 70–99)
Potassium: 3.5 mmol/L (ref 3.5–5.1)
Sodium: 137 mmol/L (ref 135–145)
Total Bilirubin: 0.5 mg/dL (ref 0.3–1.2)
Total Protein: 7.2 g/dL (ref 6.5–8.1)

## 2022-02-17 LAB — CBC
HCT: 48.5 % (ref 39.0–52.0)
Hemoglobin: 16.2 g/dL (ref 13.0–17.0)
MCH: 29.3 pg (ref 26.0–34.0)
MCHC: 33.4 g/dL (ref 30.0–36.0)
MCV: 87.9 fL (ref 80.0–100.0)
Platelets: 276 10*3/uL (ref 150–400)
RBC: 5.52 MIL/uL (ref 4.22–5.81)
RDW: 13.2 % (ref 11.5–15.5)
WBC: 8.5 10*3/uL (ref 4.0–10.5)
nRBC: 0 % (ref 0.0–0.2)

## 2022-02-17 LAB — TROPONIN I (HIGH SENSITIVITY)
Troponin I (High Sensitivity): 3 ng/L (ref <18)
Troponin I (High Sensitivity): 4 ng/L (ref <18)

## 2022-02-17 LAB — LIPASE, BLOOD: Lipase: 39 U/L (ref 11–51)

## 2022-02-17 MED ORDER — FAMOTIDINE 20 MG PO TABS: 20.0000 mg | ORAL_TABLET | Freq: Every day | ORAL | 0 refills | Status: AC

## 2022-02-17 MED ORDER — ALUM & MAG HYDROXIDE-SIMETH 200-200-20 MG/5ML PO SUSP
30.0000 mL | Freq: Once | ORAL | Status: AC
  Administered 2022-02-17: 30 mL via ORAL
  Filled 2022-02-17: qty 30

## 2022-02-17 MED ORDER — FAMOTIDINE IN NACL 20-0.9 MG/50ML-% IV SOLN
20.0000 mg | Freq: Once | INTRAVENOUS | Status: AC
  Administered 2022-02-17: 20 mg via INTRAVENOUS
  Filled 2022-02-17: qty 50

## 2022-02-17 MED ORDER — LIDOCAINE VISCOUS HCL 2 % MT SOLN
15.0000 mL | Freq: Once | OROMUCOSAL | Status: AC
Start: 2022-02-17 — End: 2022-02-17
  Administered 2022-02-17: 15 mL via ORAL
  Filled 2022-02-17: qty 15

## 2022-02-17 NOTE — ED Triage Notes (Signed)
Per pt, states he had the stomach bug over the weekend-states vomiting-states he woke up with CP this morning, went away and then came back-not sure if it is related to him being sick over weekend ?

## 2022-02-17 NOTE — ED Notes (Signed)
Patient transported to CT 

## 2022-02-17 NOTE — ED Provider Notes (Signed)
?[facility]-EMERGENCY DEPT ?Provider Note ? ? ?CSN: [id number] ?Arrival date & time: 02/17/22  1045 ? ?  ? ?History ? ?Chief Complaint  ?Patient presents with  ? Chest Pain  ? ? ?Benjamin Conrad is a 31 y.o. male with no significant past medical history who presents with concern for chest pain that started this morning just before arrival.  Patient reports that he woke up with chest pain, took some antacids and then it went away.  He had return of the chest pain and reports that it was worse.  Patient reports that he did have some kind of a stomach bug over the weekend with vomiting and nausea.  Patient denies any hematemesis.  Patient denies any history of alcohol abuse, esophageal varices, liver disease.  Patient endorses no previous history of chest pain but his father reports that he has complained of at least once or twice before.  He denies worsening with exertion.  He denies diaphoresis, radiation into the neck or left arm.  He describes the pain as a throbbing sensation.  Patient denies personal, or first-degree family history of ACS, stroke.  He denies history of high blood pressure, high cholesterol, diabetes.  He reports history of tobacco use, but does not currently smoke. ? ? ?Chest Pain ?Associated symptoms: nausea and vomiting   ? ?  ? ?Home Medications ?Prior to Admission medications   ?Medication Sig Start Date End Date Taking? Authorizing Provider  ?alum & mag hydroxide-simeth (MAALOX PLUS) 400-400-40 MG/5ML suspension Take 15 mLs by mouth daily as needed for indigestion.   Yes [provider]  ?bismuth subsalicylate (PEPTO BISMOL) 262 MG/15ML suspension Take 30 mLs by mouth daily as needed for indigestion or diarrhea or loose stools.   Yes [provider]  ?famotidine (PEPCID) 20 MG tablet Take 1 tablet (20 mg total) by mouth daily. 02/17/22  Yes Julious Langlois H, PA-C  ?Lidocaine-Glycerin (PREPARATION H EX) Apply 1 application. topically daily as needed  (hemorrhoids).   Yes [provider]  ?pantoprazole (PROTONIX) 40 MG tablet Take 1 tablet (40 mg total) by mouth daily. ?Patient not taking: Reported on 02/17/2022 03/26/16   Dione Booze, MD  ?   ? ?Allergies    ?Patient has no known allergies.   ? ?Review of Systems   ?Review of Systems  ?Cardiovascular:  Positive for chest pain.  ?Gastrointestinal:  Positive for nausea and vomiting.  ?All other systems reviewed and are negative. ? ?Physical Exam ?Updated Vital Signs ?BP 105/67   Pulse (!) 57   Temp 97.9 ?F (36.6 ?C) (Oral)   Resp 16   Ht 5\' 9"  (1.753 m)   Wt 106.6 kg   SpO2 99%   BMI 34.70 kg/m?  ?Physical Exam ?Vitals and nursing note reviewed.  ?Constitutional:   ?   General: He is not in acute distress. ?   Appearance: Normal appearance.  ?HENT:  ?   Head: Normocephalic and atraumatic.  ?Eyes:  ?   General:     ?   Right eye: No discharge.     ?   Left eye: No discharge.  ?Cardiovascular:  ?   Rate and Rhythm: Normal rate and regular rhythm.  ?   Heart sounds: No murmur heard. ?  No friction rub. No gallop.  ?Pulmonary:  ?   Effort: Pulmonary effort is normal.  ?   Breath sounds: Normal breath sounds.  ?Chest:  ?   Comments: Minimal tenderness palpation of the chest wall ?Abdominal:  ?  General: Bowel sounds are normal.  ?   Palpations: Abdomen is soft.  ?Skin: ?   General: Skin is warm and dry.  ?   Capillary Refill: Capillary refill takes less than 2 seconds.  ?Neurological:  ?   Mental Status: He is alert and oriented to person, place, and time.  ?Psychiatric:     ?   Mood and Affect: Mood normal.     ?   Behavior: Behavior normal.  ? ? ?ED Results / Procedures / Treatments   ?Labs ?(all labs ordered are listed, but only abnormal results are displayed) ?Labs Reviewed  ?COMPREHENSIVE METABOLIC PANEL - Abnormal; Notable for the following components:  ?    Result Value  ? Glucose, Bld 102 (*)   ? Calcium 8.5 (*)   ? AST 44 (*)   ? ALT 47 (*)   ? All other components within normal limits  ?CBC   ?LIPASE, BLOOD  ?TROPONIN I (HIGH SENSITIVITY)  ?TROPONIN I (HIGH SENSITIVITY)  ? ? ?EKG ?None ? ?Radiology ?DG Chest 2 View ? ?Result Date: 02/17/2022 ?CLINICAL DATA:  Chest pain EXAM: CHEST - 2 VIEW COMPARISON:  03/26/2016 FINDINGS: The heart size and mediastinal contours are within normal limits. Both lungs are clear. The visualized skeletal structures are unremarkable. IMPRESSION: No active cardiopulmonary disease. Electronically Signed   By: Ernie Avena M.D.   On: 02/17/2022 11:30   ? ?Procedures ?Procedures  ? ? ?Medications Ordered in ED ?Medications  ?famotidine (PEPCID) IVPB 20 mg premix (0 mg Intravenous Stopped 02/17/22 1433)  ?alum & mag hydroxide-simeth (MAALOX/MYLANTA) 200-200-20 MG/5ML suspension 30 mL (30 mLs Oral Given 02/17/22 1318)  ?  And  ?lidocaine (XYLOCAINE) 2 % viscous mouth solution 15 mL (15 mLs Oral Given 02/17/22 1318)  ? ? ?ED Course/ Medical Decision Making/ A&P ?  ?                        ?Medical Decision Making ?Amount and/or Complexity of Data Reviewed ?Labs: ordered. ?Radiology: ordered. ? ?Risk ?OTC drugs. ?Prescription drug management. ? ? ?This patient presents to the ED for concern of chest pain, in the context of recent Presumed gastroenteritis, nausea and vomiting this weekend, this involves an extensive number of treatment options, and is a complaint that carries with it a high risk of complications and morbidity. The emergent differential diagnosis prior to evaluation includes, but is not limited to, acute ACS, pulmonary embolism, aortic dissection, Aneurysm, Boerhaave's, Mallory-Weiss, pericarditis, endocarditis, flail chest, traumatic injury of the chest wall, esophagitis, acid reflux versus other.  ? ?This is not an exhaustive differential.  ? ?Past Medical History / Co-morbidities / Social History: ?Patient endorses tobacco use history, none within last 90 days. No known hx HTN, HLD, DM. BMI >30 ? ?Additional history: ?Additional history obtained from patient's  father. External records from outside source obtained and reviewed including previous ED visits, ENT visits. ? ?Physical Exam: ?Physical exam performed. The pertinent findings include: Overall well-appearing patient in NAD. Minimal TTP chest wall. No active chest pain at time of eval. ? ?Lab Tests: ?I ordered, and personally interpreted labs.  The pertinent results include:  mild elevation of AST, ALT, probably secondary to recent GI illness. Electrolytes stable other than mild hypocalcemia. CBC unremarkable. Lipase normal. Troponin negative x 2. ?  ?Imaging Studies: ?I ordered imaging studies including plain film radiographic imaging of the chest. I independently visualized and interpreted imaging which showed no intrathoracic abnormality. I agree with the  radiologist interpretation. ?  ?Cardiac Monitoring:  ?The patient was maintained on a cardiac monitor.  My attending physician Dr. Freida BusmanAllen viewed and interpreted the cardiac monitored which showed an underlying rhythm of: NSR ?  ?Medications: ?I ordered medication including pepcid, maalox/mylanta  for presumed GI pain. Reevaluation of the patient after these medicines showed that the patient improved. I have reviewed the patients home medicines and have made adjustments as needed. Pain resolved at time of discharge ?  ?Disposition: ?After consideration of the diagnostic results and the patients response to treatment, I feel that with a heart score 2.  A story that seems consistent with. Gastroenteritis this weekend with esophagitis versus acid reflux pain today.  He has improvement with over-the-counter acid reflux medication.  He does not have return of chest pain while in emergency department for evaluation. ? ?He has improvement of pain with medications aimed at acid reflux.  Encouraged follow-up with GI.  Patient discharged in stable condition at this time. discussed extensive return precautions. ? ?Final Clinical Impression(s) / ED Diagnoses ?Final diagnoses:   ?Chest pain, unspecified type  ?Nausea and vomiting, unspecified vomiting type  ? ? ?Rx / DC Orders ?ED Discharge Orders   ? ?      Ordered  ?  famotidine (PEPCID) 20 MG tablet  Daily       ? 02/17/22 1416  ? ?  ?

## 2022-02-17 NOTE — Discharge Instructions (Addendum)
Please start taking the Pepcid once daily.  If you have additional chest pain, burning sensation despite taking Pepcid you can try some over-the-counter Maalox or Mylanta, as well as Tums.  If you continue to have chest pain, shortness of breath please return to the emergency department for further evaluation.  I recommend that you follow-up with gastroenterology for probable acid reflux.  ? ?It was a pleasure taking care of you today, hope that you feel better soon. ?

## 2022-02-17 NOTE — ED Notes (Signed)
Patient back from CT.

## 2023-11-11 IMAGING — CR DG CHEST 2V
2 series · 2 of 2 positions shown · non-contrast
Comparison: 03/26/2016

CLINICAL DATA: Chest pain

EXAM:
CHEST - 2 VIEW

[w chest pa]
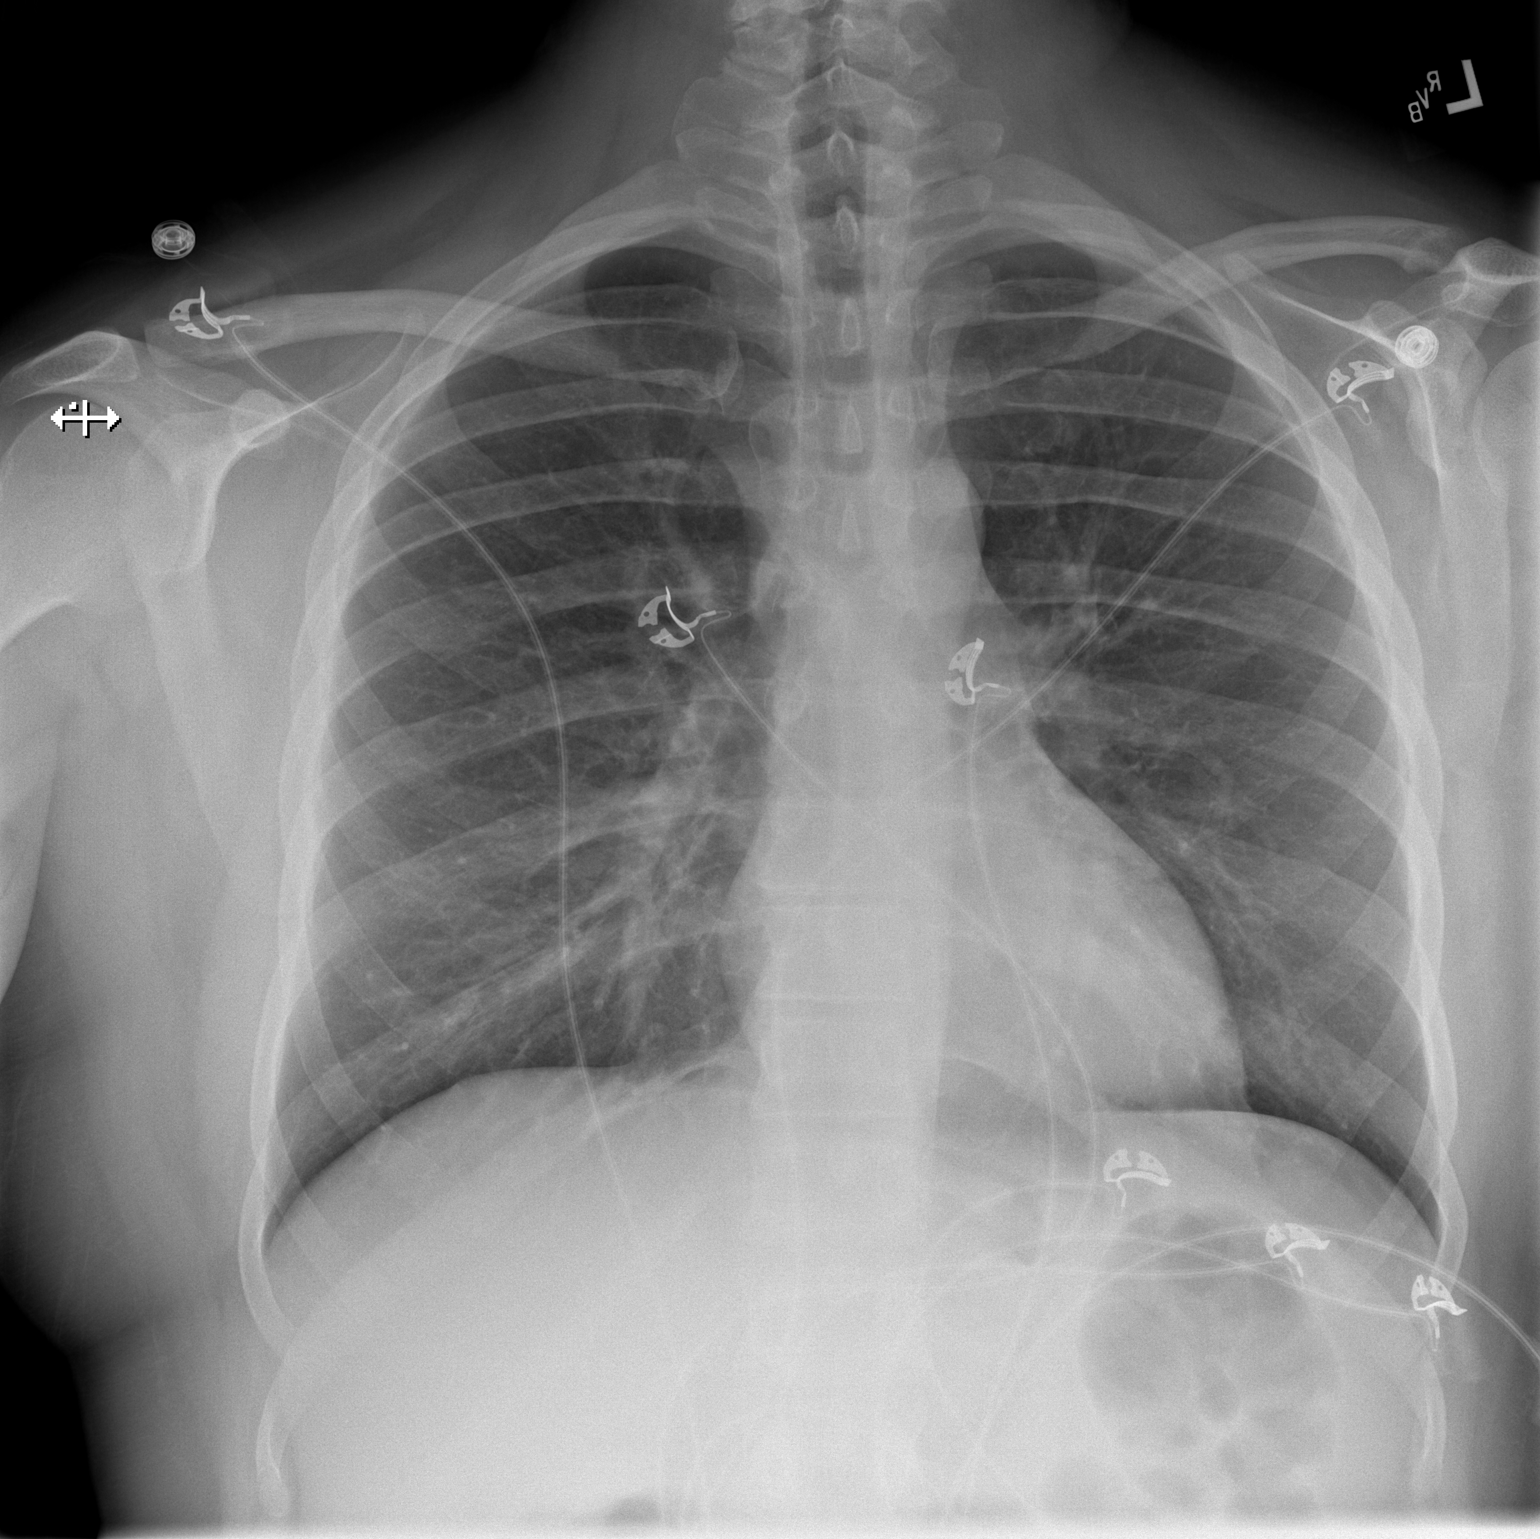

[w chest lat]
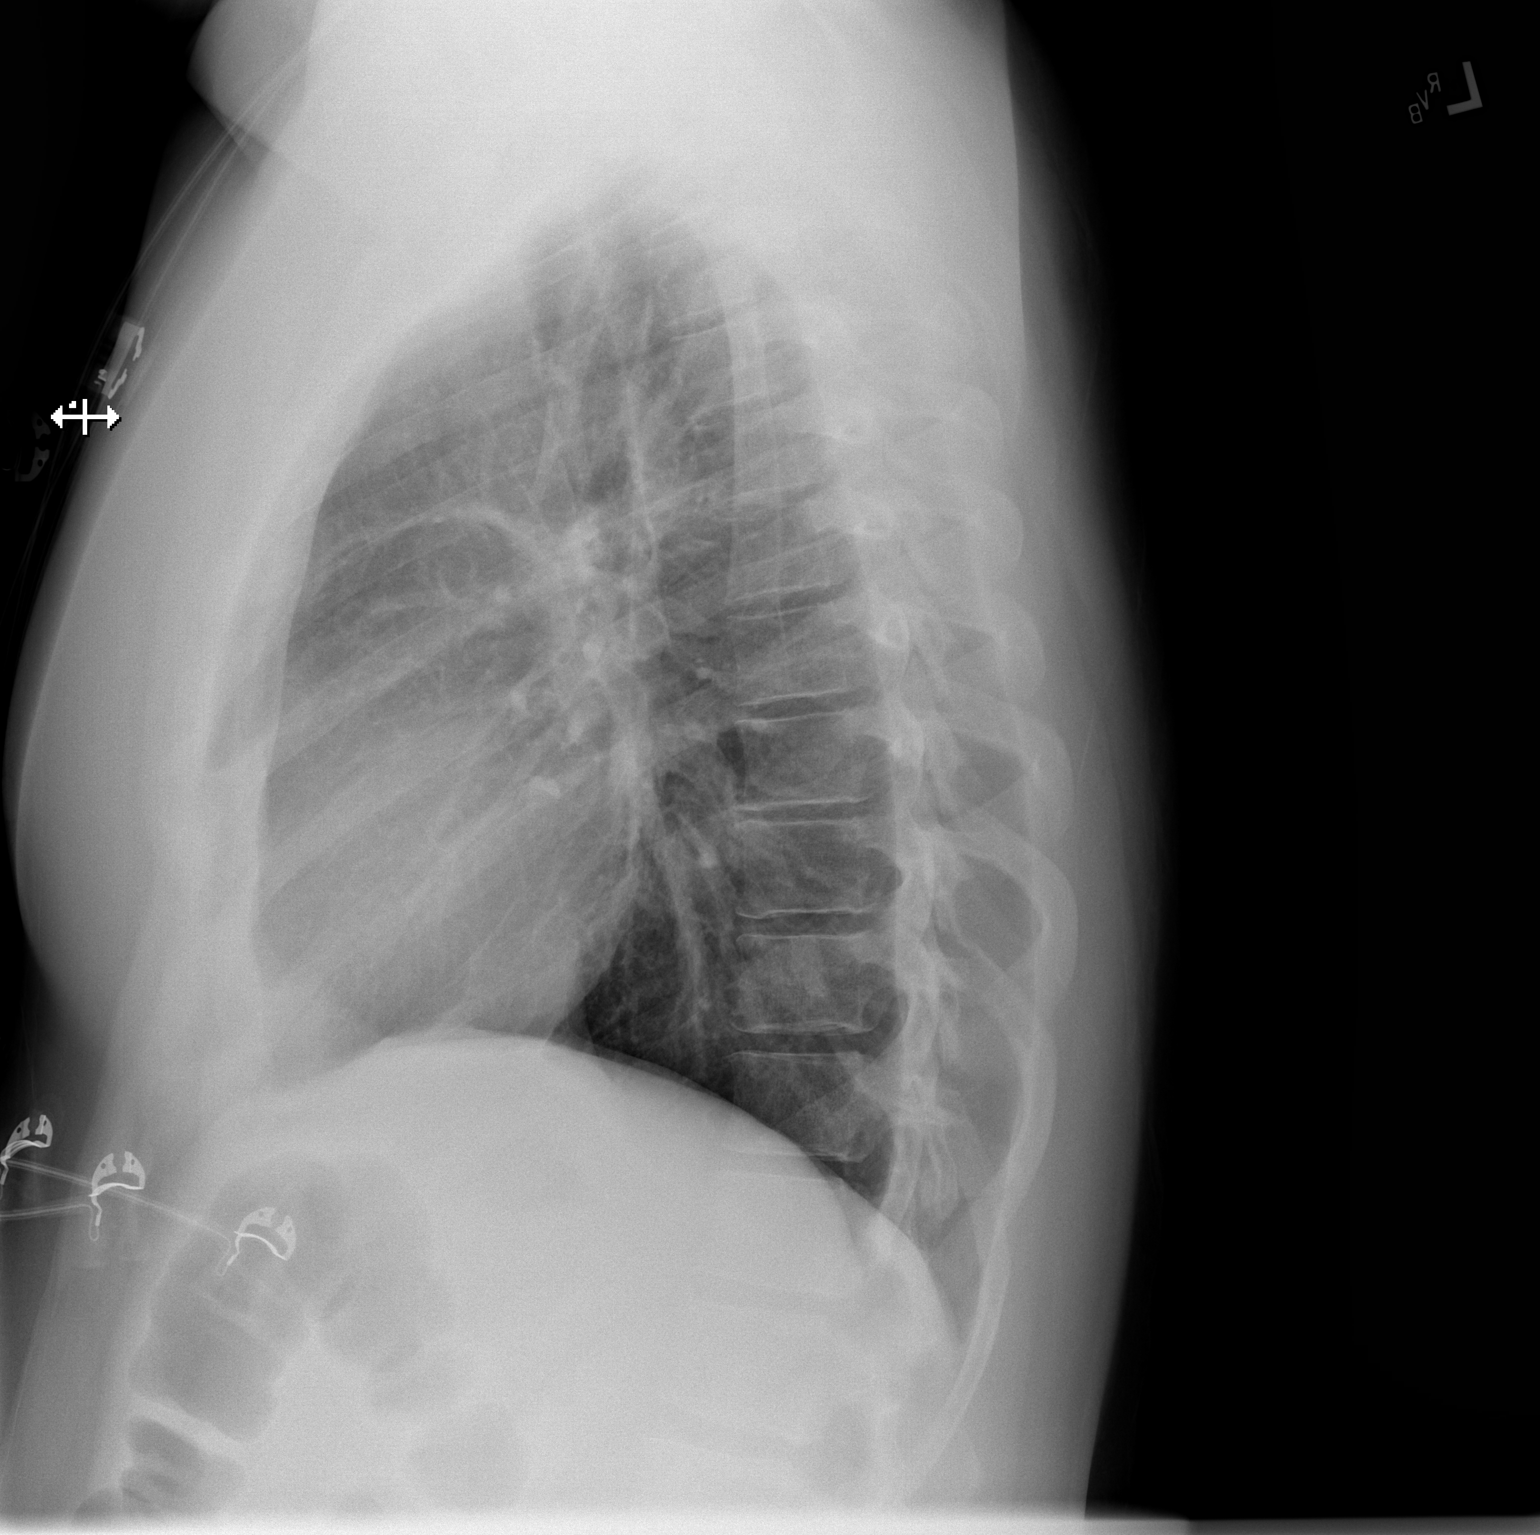

[2 of 2 positions shown; findings below may reference images not displayed]

FINDINGS: The heart size and mediastinal contours are within normal limits.
Both lungs are clear. The visualized skeletal structures are
unremarkable.
IMPRESSION: No active cardiopulmonary disease.
